# Patient Record
Sex: Female | Born: 1995 | Hispanic: No | Marital: Single | Smoking: Never smoker
Health system: Southern US, Community
[De-identification: ages and names within clinical notes are randomized; demographics above are authoritative.]

## PROBLEM LIST (undated history)

## (undated) DIAGNOSIS — L509 Urticaria, unspecified: Secondary | ICD-10-CM

## (undated) DIAGNOSIS — J309 Allergic rhinitis, unspecified: Secondary | ICD-10-CM

## (undated) DIAGNOSIS — L709 Acne, unspecified: Secondary | ICD-10-CM

## (undated) DIAGNOSIS — R062 Wheezing: Secondary | ICD-10-CM

## (undated) HISTORY — DX: Allergic rhinitis, unspecified: J30.9

## (undated) HISTORY — DX: Acne, unspecified: L70.9

## (undated) HISTORY — DX: Urticaria, unspecified: L50.9

## (undated) HISTORY — DX: Wheezing: R06.2

---
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
## 2016-01-26 DIAGNOSIS — J302 Other seasonal allergic rhinitis: Secondary | ICD-10-CM | POA: Insufficient documentation

## 2016-02-21 ENCOUNTER — Encounter: Payer: Self-pay | Admitting: Pediatrics

## 2016-02-21 ENCOUNTER — Ambulatory Visit (INDEPENDENT_AMBULATORY_CARE_PROVIDER_SITE_OTHER): Payer: 59 | Admitting: Pediatrics

## 2016-02-21 VITALS — BP 100/64 | HR 70 | Temp 98.0°F | Resp 16 | Ht 66.4 in | Wt 149.0 lb

## 2016-02-21 DIAGNOSIS — J4520 Mild intermittent asthma, uncomplicated: Secondary | ICD-10-CM | POA: Insufficient documentation

## 2016-02-21 DIAGNOSIS — J301 Allergic rhinitis due to pollen: Secondary | ICD-10-CM | POA: Insufficient documentation

## 2016-02-21 MED ORDER — MONTELUKAST SODIUM 10 MG PO TABS: ORAL_TABLET | ORAL | Status: AC

## 2016-02-21 MED ORDER — ALBUTEROL SULFATE HFA 108 (90 BASE) MCG/ACT IN AERS: 2.0000 | INHALATION_SPRAY | RESPIRATORY_TRACT | Status: DC | PRN

## 2016-02-21 MED ORDER — TRIAMCINOLONE ACETONIDE 0.1 % EX CREA: TOPICAL_CREAM | CUTANEOUS | Status: AC

## 2016-02-21 MED ORDER — FLUTICASONE PROPIONATE 50 MCG/ACT NA SUSP: NASAL | Status: AC

## 2016-02-21 NOTE — Progress Notes (Signed)
[address] [place] [address] Dept: [phone]  New Patient Note  Patient ID: Chelsea Hodges, female    DOB: December 13, 1995  Age: 20 y.o. MRN: [id number] Date of Office Visit: 02/21/2016 Referring provider: Angelica Chessman, MD [address] [place], [place] [address]    Chief Complaint: Urticaria; Nasal Congestion; and Breathing Problem  HPI Chelsea Hodges presents for evaluation of nasal congestion and shortness of breath with exercise. She also has had difficulties from  insect bites with  Itching that  last several days. They're worse when she has been lying down on  the grass when she plays soccer. During the past year she has had some shortness of breath with exercise and has improved with the use of Ventolin 2 puffs before exercise. She has had seasonal allergic rhinitis for 5 years. She has never had nasal polyps. She has aggravation of her nasal congestion on exposure to pollens, weather changes and the spring and fall of the year She does not have a history of eczema or generalized hives.  Review of Systems  Constitutional: Negative.   HENT:       Seasonal allergic rhinitis for several years  Eyes: Negative.   Respiratory:       Shortness of breath with exercise for about a year. She has improved with albuterol before exercise  Cardiovascular: Negative.   Gastrointestinal: Negative.   Genitourinary:       Occasional bladder infections  Musculoskeletal: Negative.   Skin:       Itchy areas from the insect bites which can last several days  Neurological: Negative.   Endo/Heme/Allergies:       No diabetes or thyroid disease  Psychiatric/Behavioral: Negative.     Outpatient Encounter Prescriptions as of 02/21/2016  Medication Sig  . albuterol (VENTOLIN HFA) 108 (90 Base) MCG/ACT inhaler Inhale 2 puffs into the lungs every 4 (four) hours as needed for wheezing or shortness of breath.  . clindamycin (CLEOCIN T) 1 % external solution APP AA QD PRN  . fluconazole  (DIFLUCAN) 150 MG tablet Reported on 02/21/2016  . fluticasone (FLONASE) 50 MCG/ACT nasal spray TWO SPRAYS EACH NOSTRIL ONCE A DAY FOR NASAL CONGESTION OR DRAINAGE.  Marland Kitchen metroNIDAZOLE (FLAGYL) 500 MG tablet Reported on 02/21/2016  . MONONESSA 0.25-35 MG-MCG tablet   . montelukast (SINGULAIR) 10 MG tablet ONE TABLET IN MORNING OF DAYS OF EXERCISE OR SOCCER.  Marland Kitchen sulfamethoxazole-trimethoprim (BACTRIM DS,SEPTRA DS) 800-160 MG tablet TK 1 T PO BID  . triamcinolone cream (KENALOG) 0.1 % APPLY TO RED ITCHY AREAS TWICE A DAY TO INSECT BITES.  Marland Kitchen trimethoprim (TRIMPEX) 100 MG tablet   . [DISCONTINUED] albuterol (VENTOLIN HFA) 108 (90 Base) MCG/ACT inhaler Inhale 2 puffs into the lungs every 4 (four) hours as needed for wheezing or shortness of breath.  . [DISCONTINUED] clindamycin (CLEOCIN T) 1 % external solution   . [DISCONTINUED] metroNIDAZOLE (FLAGYL) 500 MG tablet Take 500 mg by mouth.  . [DISCONTINUED] sulfamethoxazole-trimethoprim (BACTRIM DS,SEPTRA DS) 800-160 MG tablet    No facility-administered encounter medications on file as of 02/21/2016.     Drug Allergies:  Not on File  Family History: Nyelli's family history includes Allergic rhinitis in her sister; Hypertension in her father. There is no history of Angioedema, Asthma, Eczema, Immunodeficiency, or Urticaria..  Social and environmental. 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 There is a dog in her home. She is not exposed to cigarette smoking. She has never smoked cigarettes.  Physical Exam:  BP 100/64 mmHg  Pulse 70  Temp(Src) 98 F (36.7 C) (Oral)  Resp 16  Ht 5' 6.4" (1.687 m)  Wt 149 lb (67.586 kg)  BMI 23.75 kg/m2   Physical Exam  Constitutional: She is oriented to person, place, and time. She appears well-developed and well-nourished.  HENT:  Eyes normal. Ears normal. Nose normal. Pharynx normal.  Neck: Neck supple. No thyromegaly present.  Cardiovascular:  S1 and S2 normal no murmurs  Pulmonary/Chest:  Clear to  percussion and auscultation  Abdominal: Soft. There is no tenderness (no hepatosplenomegaly).  Lymphadenopathy:    She has no cervical adenopathy.  Neurological: She is alert and oriented to person, place, and time.  Skin:  Clear  Psychiatric: She has a normal mood and affect. Her behavior is normal. Judgment and thought content normal.  Vitals reviewed.   Diagnostics: FVC 3.10 L FEV1 2.59 L. Predicted FVC 4.11 L predicted FEV1 3.62 L. After albuterol 2 puffs FVC 3.12 L FEV1 2.99 L-this shows a mild reduction in the forced vital capacity. The FEV1 did improve 15% after albuterol  Allergy skin tests were positive to grass pollen, weeds, tree pollens, molds, cat, dog, cockroach   Assessment Assessment and Plan: 1. Mild intermittent asthma, uncomplicated   2. Allergic rhinitis due to pollen     Meds ordered this encounter  Medications  . fluticasone (FLONASE) 50 MCG/ACT nasal spray    Sig: TWO SPRAYS EACH NOSTRIL ONCE A DAY FOR NASAL CONGESTION OR DRAINAGE.    Dispense:  16 g    Refill:  5    HOLD RX. PT WILL CALL FOR FILL.  Marland Kitchen. montelukast (SINGULAIR) 10 MG tablet    Sig: ONE TABLET IN MORNING OF DAYS OF EXERCISE OR SOCCER.    Dispense:  30 tablet    Refill:  5  . albuterol (VENTOLIN HFA) 108 (90 Base) MCG/ACT inhaler    Sig: Inhale 2 puffs into the lungs every 4 (four) hours as needed for wheezing or shortness of breath.    Dispense:  8 g    Refill:  1  . triamcinolone cream (KENALOG) 0.1 %    Sig: APPLY TO RED ITCHY AREAS TWICE A DAY TO INSECT BITES.    Dispense:  45 g    Refill:  2    Patient Instructions  Zyrtec 10 mg once a day when you to play soccer and lies  on the grass. This will help a  runny nose and itching Fluticasone 2 sprays per nostril once a day if needed for stuffy nose Ventolin 2 puffs every 4 hours if needed for coughing or wheezing. You may use Ventolin 2 puffs 5-15 minutes before exercise If Ventolin alone does not take care of your difficulties with  exercise, you may add montelukast  10 mg in the morning of the days that you're going to exercise or play soccer. Call me if you are not doing well on this treatment plan  If you have an insect bite,  use Benadryl cream 3 times a day on the insect bites. You may add   triamcinolone 0.1% cream twice a day to the insect bites    Return in about 1 year (around 02/20/2017), or if symptoms worsen or fail to improve.   Thank you for the opportunity to care for this patient.  Please do not hesitate to contact me with questions.  Tonette BihariJ. A. Bardelas, M.D.  [facility]  [address] [place], [place] [id number] [phone]  [phone]     

## 2016-02-21 NOTE — Patient Instructions (Addendum)
Zyrtec 10 mg once a day when you to play soccer and lies  on the grass. This will help a  runny nose and itching Fluticasone 2 sprays per nostril once a day if needed for stuffy nose Ventolin 2 puffs every 4 hours if needed for coughing or wheezing. You may use Ventolin 2 puffs 5-15 minutes before exercise If Ventolin alone does not take care of your difficulties with exercise, you may add montelukast  10 mg in the morning of the days that you're going to exercise or play soccer. Call me if you are not doing well on this treatment plan  If you have an insect bite,  use Benadryl cream 3 times a day on the insect bites. You may add   triamcinolone 0.1% cream twice a day to the insect bites

## 2017-01-29 ENCOUNTER — Ambulatory Visit (INDEPENDENT_AMBULATORY_CARE_PROVIDER_SITE_OTHER): Payer: PRIVATE HEALTH INSURANCE | Admitting: Sports Medicine

## 2017-01-29 ENCOUNTER — Ambulatory Visit (INDEPENDENT_AMBULATORY_CARE_PROVIDER_SITE_OTHER): Payer: 59

## 2017-01-29 DIAGNOSIS — M25561 Pain in right knee: Secondary | ICD-10-CM

## 2017-01-29 DIAGNOSIS — M222X2 Patellofemoral disorders, left knee: Secondary | ICD-10-CM

## 2017-01-29 DIAGNOSIS — G8929 Other chronic pain: Secondary | ICD-10-CM

## 2017-01-29 DIAGNOSIS — M25861 Other specified joint disorders, right knee: Secondary | ICD-10-CM | POA: Diagnosis not present

## 2017-01-29 MED ORDER — MELOXICAM 15 MG PO TABS: ORAL_TABLET | ORAL | 3 refills | Status: AC

## 2017-01-29 NOTE — Assessment & Plan Note (Signed)
Present now for one year, on and off, localized at the medial joint line just distal to the tibial plateau. She does have some pain over the pes bursa but also posteriorly concerning for tibial stress injury. Considering duration of pain we are going to get x-rays and an MRI. Adding meloxicam. Return to go over MRI results.

## 2017-01-29 NOTE — Assessment & Plan Note (Signed)
X-rays, meloxicam, rehabilitation exercises for patellofemoral syndrome with PT at her school. Return for custom molded orthotics.

## 2017-01-29 NOTE — Progress Notes (Addendum)
   Subjective:    I'm seeing this patient as a consultation for:  Dr. 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   For the past year she's had pain in her right knee, localized just distal to the medial joint line, she has been doing therapy and working with her athletic trainer through multiple modalities without much improvement. Symptoms are moderate, persistent,  Mild swelling, no mechanical symptoms.   With regards to her left knee, pain is under the medial patellar facet. Moderate, persistent without radiation, no mechanical symptoms.  Past medical history:  Negative.  See flowsheet/record as well for more information.  Surgical history: Negative.  See flowsheet/record as well for more information.  Family history: Negative.  See flowsheet/record as well for more information.  Social history: Negative.  See flowsheet/record as well for more information.  Allergies, and medications have been entered into the medical record, reviewed, and no changes needed.   Review of Systems: No headache, visual changes, nausea, vomiting, diarrhea, constipation, dizziness, abdominal pain, skin rash, fevers, chills, night sweats, weight loss, swollen lymph nodes, body aches, joint swelling, muscle aches, chest pain, shortness of breath, mood changes, visual or auditory hallucinations.   Objective:   General: Well Developed, well nourished, and in no acute distress.  Neuro/Psych: Alert and oriented x3, extra-ocular muscles intact, able to move all 4 extremities, sensation grossly intact. Skin: Warm and dry, no rashes noted.  Respiratory: Not using accessory muscles, speaking in full sentences, trachea midline.  Cardiovascular: Pulses palpable, no extremity edema. Abdomen: Does not appear distended. Right Knee: Normal to inspection with no erythema or effusion or obvious bony abnormalities. Tender to palpation over the  pes anserine bursa but also along the medial tibia just distal to the plateau. ROM normal in flexion and extension and lower leg rotation. Ligaments with solid consistent endpoints including ACL, PCL, LCL, MCL. Negative Mcmurray's and provocative meniscal tests. Non painful patellar compression. Patellar and quadriceps tendons unremarkable. Hamstring and quadriceps strength is normal. Left Knee: Normal to inspection with no erythema or effusion or obvious bony abnormalities. Tender to palpation of the patellar facets ROM normal in flexion and extension and lower leg rotation. Ligaments with solid consistent endpoints including ACL, PCL, LCL, MCL. Negative Mcmurray's and provocative meniscal tests. Non painful patellar compression. Patellar and quadriceps tendons unremarkable. Hamstring and quadriceps strength is normal. Hip abductor strength is good bilaterally  Impression and Recommendations:   This case required medical decision making of moderate complexity.  Right knee pain Present now for one year, on and off, localized at the medial joint line just distal to the tibial plateau. She does have some pain over the pes bursa but also posteriorly concerning for tibial stress injury. Considering duration of pain we are going to get x-rays and an MRI. Adding meloxicam. Return to go over MRI results.   Patellofemoral syndrome of left knee X-rays, meloxicam, rehabilitation exercises for patellofemoral syndrome with PT at her school. Return for custom molded orthotics.  Per patient request I will forward this office note to her athletic trainer.

## 2017-02-02 ENCOUNTER — Ambulatory Visit (HOSPITAL_BASED_OUTPATIENT_CLINIC_OR_DEPARTMENT_OTHER): Payer: 59

## 2017-02-05 ENCOUNTER — Ambulatory Visit (INDEPENDENT_AMBULATORY_CARE_PROVIDER_SITE_OTHER): Payer: 59

## 2017-02-05 DIAGNOSIS — G8929 Other chronic pain: Secondary | ICD-10-CM

## 2017-02-05 DIAGNOSIS — M25561 Pain in right knee: Secondary | ICD-10-CM

## 2017-02-07 ENCOUNTER — Encounter: Payer: Self-pay | Admitting: Sports Medicine

## 2017-02-07 ENCOUNTER — Ambulatory Visit (INDEPENDENT_AMBULATORY_CARE_PROVIDER_SITE_OTHER): Payer: 59 | Admitting: Sports Medicine

## 2017-02-07 DIAGNOSIS — M25561 Pain in right knee: Secondary | ICD-10-CM | POA: Diagnosis not present

## 2017-02-07 DIAGNOSIS — G8929 Other chronic pain: Secondary | ICD-10-CM

## 2017-02-07 NOTE — Assessment & Plan Note (Signed)
MRI was negative, making the diagnosis likely pes anserine bursitis. She's not hurting right now so no injections will be given. She has not yet tried the meloxicam. I have recommended that she get back into her sport, ice the affected area for 20 minutes after participation, and use meloxicam either before or after participation if it continues to hurt. Return as needed.

## 2017-02-07 NOTE — Progress Notes (Signed)
  Subjective:    CC: MRI follow-up  HPI: Chelsea Hodges is a pleasant 21 year old female, she had years of knee pain without a diagnosis, clinical diagnosis was pes bursitis.  We will obtain an MRI that was completely negative. She is currently pain-free actually, has not yet taken her meloxicam.  Past medical history:  Negative.  See flowsheet/record as well for more information.  Surgical history: Negative.  See flowsheet/record as well for more information.  Family history: Negative.  See flowsheet/record as well for more information.  Social history: Negative.  See flowsheet/record as well for more information.  Allergies, and medications have been entered into the medical record, reviewed, and no changes needed.   Review of Systems: No fevers, chills, night sweats, weight loss, chest pain, or shortness of breath.   Objective:    General: Well Developed, well nourished, and in no acute distress.  Neuro: Alert and oriented x3, extra-ocular muscles intact, sensation grossly intact.  HEENT: Normocephalic, atraumatic, pupils equal round reactive to light, neck supple, no masses, no lymphadenopathy, thyroid nonpalpable.  Skin: Warm and dry, no rashes. Cardiac: Regular rate and rhythm, no murmurs rubs or gallops, no lower extremity edema.  Respiratory: Clear to auscultation bilaterally. Not using accessory muscles, speaking in full sentences. Right Knee: Normal to inspection with no erythema or effusion or obvious bony abnormalities. Palpation normal with no warmth or joint line tenderness or patellar tenderness or condyle tenderness. ROM normal in flexion and extension and lower leg rotation. Ligaments with solid consistent endpoints including ACL, PCL, LCL, MCL. Negative Mcmurray's and provocative meniscal tests. Non painful patellar compression. Patellar and quadriceps tendons unremarkable. Hamstring and quadriceps strength is normal.  Impression and Recommendations:    Right knee  pain MRI was negative, making the diagnosis likely pes anserine bursitis. She's not hurting right now so no injections will be given. She has not yet tried the meloxicam. I have recommended that she get back into her sport, ice the affected area for 20 minutes after participation, and use meloxicam either before or after participation if it continues to hurt. Return as needed.

## 2017-04-01 ENCOUNTER — Other Ambulatory Visit: Payer: Self-pay | Admitting: Allergy

## 2017-04-01 MED ORDER — ALBUTEROL SULFATE HFA 108 (90 BASE) MCG/ACT IN AERS: 2.0000 | INHALATION_SPRAY | RESPIRATORY_TRACT | 0 refills | Status: AC | PRN

## 2018-04-23 IMAGING — DX DG KNEE COMPLETE 4+V*R*
3 series · 3 of 3 positions shown · non-contrast
Comparison: No recent prior .

CLINICAL DATA: Chronic knee pain.

EXAM:
RIGHT KNEE - COMPLETE 4+ VIEW

[knee lat]
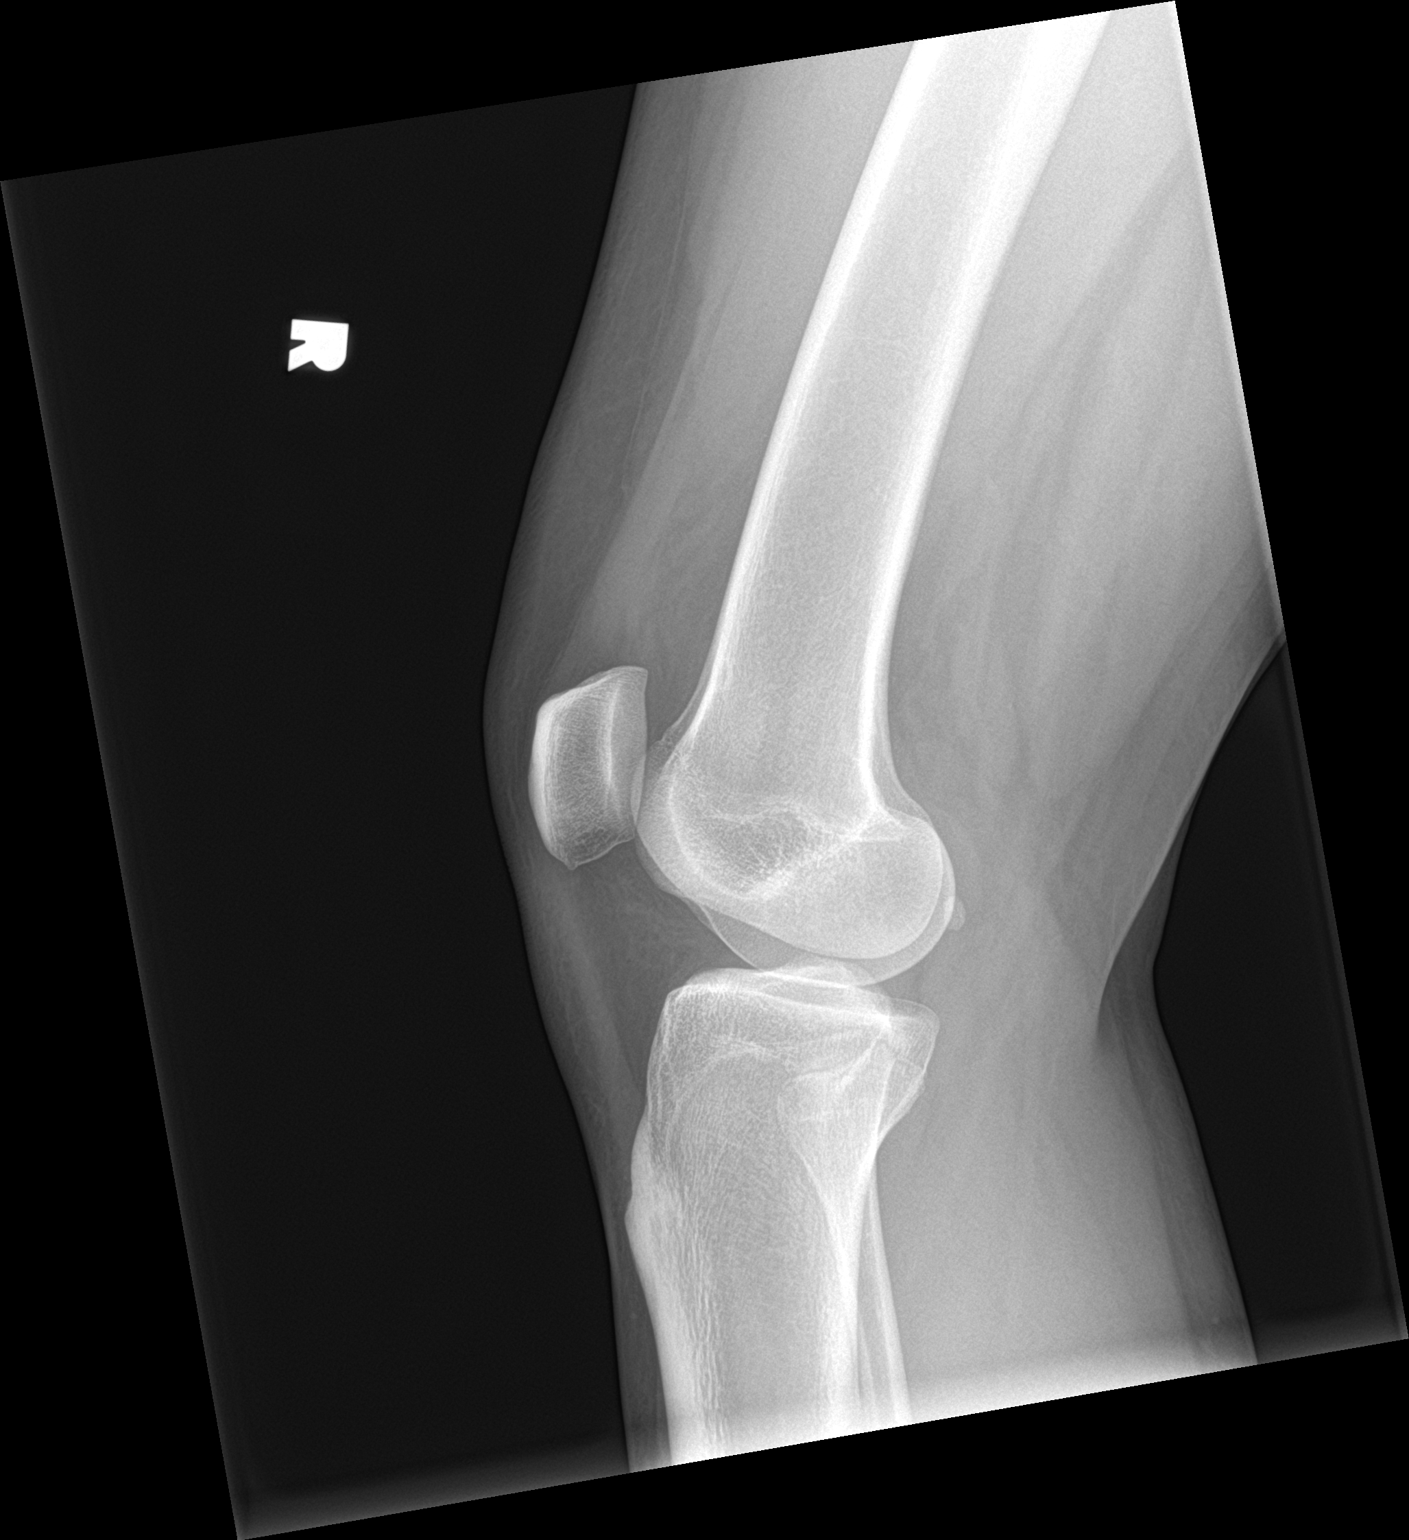

[knee sunrise]
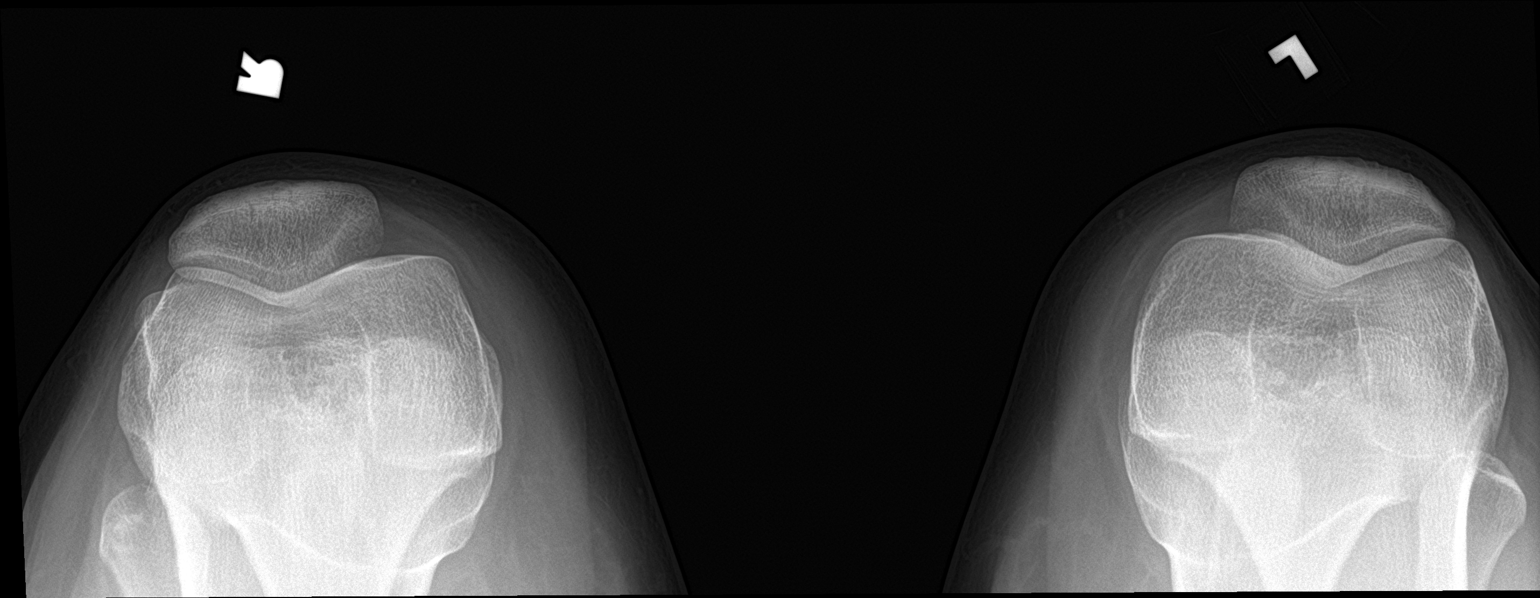

[knee ap bilat standing]
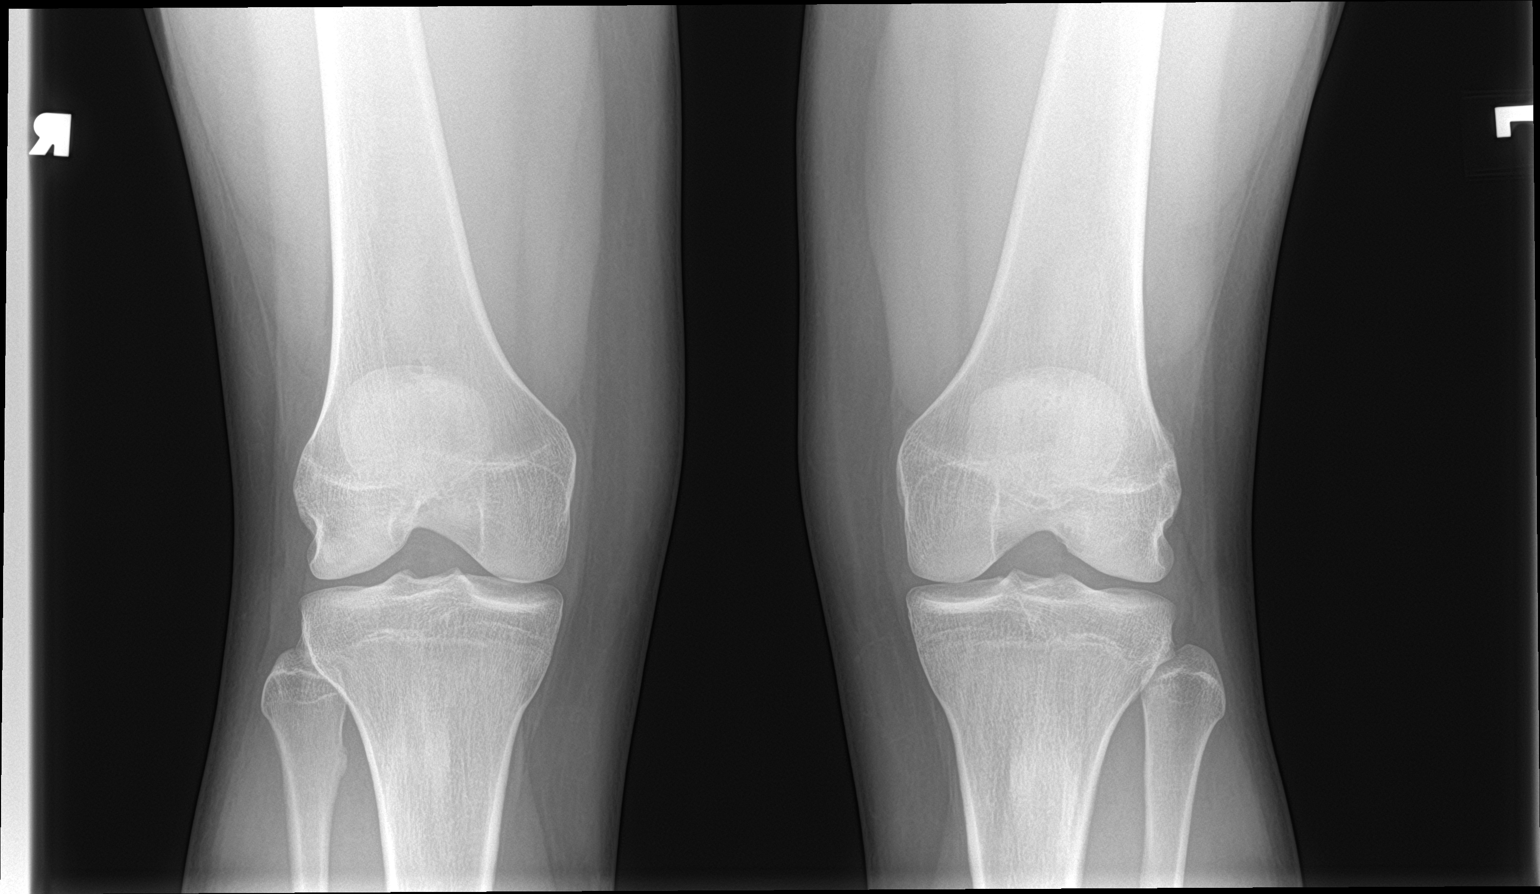

[3 of 3 positions shown; findings below may reference images not displayed]

FINDINGS: Tiny corticated cyst noted in the anterior aspect of the distal
right femoral metaphysis. No acute bony abnormality identified. No
evidence of effusion.
IMPRESSION: Tiny corticated cyst in the anterior aspect of the distal right
femoral metaphysis. No acute bony abnormality.

## 2023-03-22 ENCOUNTER — Emergency Department (HOSPITAL_BASED_OUTPATIENT_CLINIC_OR_DEPARTMENT_OTHER)
Admission: EM | Admit: 2023-03-22 | Discharge: 2023-03-23 | Discharge status: Against Medical Advice | Payer: PRIVATE HEALTH INSURANCE | Attending: Emergency Medicine | Admitting: Emergency Medicine

## 2023-03-22 ENCOUNTER — Other Ambulatory Visit: Payer: Self-pay

## 2023-03-22 ENCOUNTER — Encounter (HOSPITAL_BASED_OUTPATIENT_CLINIC_OR_DEPARTMENT_OTHER): Payer: Self-pay | Admitting: Emergency Medicine

## 2023-03-22 ENCOUNTER — Emergency Department (HOSPITAL_BASED_OUTPATIENT_CLINIC_OR_DEPARTMENT_OTHER): Payer: PRIVATE HEALTH INSURANCE

## 2023-03-22 DIAGNOSIS — S61251A Open bite of left index finger without damage to nail, initial encounter: Secondary | ICD-10-CM | POA: Insufficient documentation

## 2023-03-22 DIAGNOSIS — Z5321 Procedure and treatment not carried out due to patient leaving prior to being seen by health care provider: Secondary | ICD-10-CM | POA: Insufficient documentation

## 2023-03-22 DIAGNOSIS — W5501XA Bitten by cat, initial encounter: Secondary | ICD-10-CM | POA: Insufficient documentation

## 2023-03-22 NOTE — ED Triage Notes (Addendum)
Patient with cat bite to left pointer finger about one hour ago.  She states that it was a kitten.  Probably two to three weeks old.  Finger is swollen.
